# Patient Record
Sex: Male | Born: 1950 | Race: White | Hispanic: No | Marital: Married | Smoking: Never smoker
Health system: Southern US, Community
[De-identification: ages and names within clinical notes are randomized; demographics above are authoritative.]

## PROBLEM LIST (undated history)

## (undated) DIAGNOSIS — I213 ST elevation (STEMI) myocardial infarction of unspecified site: Secondary | ICD-10-CM

## (undated) DIAGNOSIS — Z955 Presence of coronary angioplasty implant and graft: Secondary | ICD-10-CM

## (undated) HISTORY — DX: Presence of coronary angioplasty implant and graft: Z95.5

## (undated) HISTORY — DX: ST elevation (STEMI) myocardial infarction of unspecified site: I21.3

---

## 2012-11-27 DIAGNOSIS — Z955 Presence of coronary angioplasty implant and graft: Secondary | ICD-10-CM

## 2012-11-27 DIAGNOSIS — I213 ST elevation (STEMI) myocardial infarction of unspecified site: Secondary | ICD-10-CM

## 2012-11-27 HISTORY — DX: Presence of coronary angioplasty implant and graft: Z95.5

## 2012-11-27 HISTORY — DX: ST elevation (STEMI) myocardial infarction of unspecified site: I21.3

## 2013-01-16 ENCOUNTER — Encounter (HOSPITAL_COMMUNITY): Payer: Self-pay | Admitting: *Deleted

## 2013-01-16 ENCOUNTER — Telehealth (HOSPITAL_COMMUNITY): Payer: Self-pay | Admitting: *Deleted

## 2013-01-17 ENCOUNTER — Encounter (HOSPITAL_COMMUNITY)
Admission: RE | Admit: 2013-01-17 | Discharge: 2013-01-17 | Disposition: A | Discharge status: Home / Self Care | Payer: Managed Care, Other (non HMO) | Source: Ambulatory Visit | Attending: Cardiology | Admitting: Cardiology

## 2013-01-17 DIAGNOSIS — Z5189 Encounter for other specified aftercare: Secondary | ICD-10-CM | POA: Insufficient documentation

## 2013-01-17 DIAGNOSIS — I252 Old myocardial infarction: Secondary | ICD-10-CM | POA: Insufficient documentation

## 2013-01-17 NOTE — Progress Notes (Signed)
Cardiac Rehab Medication Review by a Pharmacist  Does the patient  feel that his/her medications are working for him/her?  yes  Has the patient been experiencing any side effects to the medications prescribed?  no  Does the patient measure his/her own blood pressure or blood glucose at home?  yes   Does the patient have any problems obtaining medications due to transportation or finances?   no  Understanding of regimen: good Understanding of indications: fair Potential of compliance: good    Pharmacist comments: Martin Moreno is a pleasant 62 yo male in for Cardiac Rehab, and he brings with him a list of his medications that his wife created.  He reports no side effects with his medications.  He checks his blood pressure around twice a week, but does not keep a log.  Since his heart attach, he has been very compliant with his meds, and understands the importance of the Brilinta with his stents.  He was unsure why he was taking some of his meds, so we went through each one and talked about its indications.  Martin Moreno takes no OTC items or herbals.    Shelba Flake Achilles Dunk, PharmD Clinical Pharmacist - Resident Pager: [phone] Pharmacy: [phone] 01/17/2013 9:32 AM

## 2013-01-21 ENCOUNTER — Encounter (HOSPITAL_COMMUNITY)
Admission: RE | Admit: 2013-01-21 | Discharge: 2013-01-21 | Disposition: A | Discharge status: Home / Self Care | Payer: Managed Care, Other (non HMO) | Source: Ambulatory Visit | Attending: Cardiology | Admitting: Cardiology

## 2013-01-21 LAB — GLUCOSE, CAPILLARY
Glucose-Capillary: 126 mg/dL — ABNORMAL HIGH (ref 70–99)
Glucose-Capillary: 101 mg/dL — ABNORMAL HIGH (ref 70–99)

## 2013-01-21 NOTE — Progress Notes (Signed)
Patient tolerated exercise session well. Oriented to gym safety and routines. Reviewed medications and PQ9. Patient feels he is coping with day to day living and sees cardiac rehab as an opportunity to build confidence.  Patient understands to communicate any needs to staff. Staff will continue to provide encouragement and support. VSS, ECG tracing NSR.

## 2013-01-23 ENCOUNTER — Encounter (HOSPITAL_COMMUNITY)
Admission: RE | Admit: 2013-01-23 | Discharge: 2013-01-23 | Disposition: A | Discharge status: Home / Self Care | Payer: Managed Care, Other (non HMO) | Source: Ambulatory Visit | Attending: Cardiology | Admitting: Cardiology

## 2013-01-23 ENCOUNTER — Encounter (HOSPITAL_COMMUNITY): Payer: Managed Care, Other (non HMO)

## 2013-01-23 LAB — GLUCOSE, CAPILLARY: Glucose-Capillary: 118 mg/dL — ABNORMAL HIGH (ref 70–99)

## 2013-01-25 ENCOUNTER — Encounter (HOSPITAL_COMMUNITY)
Admission: RE | Admit: 2013-01-25 | Discharge: 2013-01-25 | Disposition: A | Discharge status: Home / Self Care | Payer: Managed Care, Other (non HMO) | Source: Ambulatory Visit | Attending: Cardiology | Admitting: Cardiology

## 2013-01-25 DIAGNOSIS — I252 Old myocardial infarction: Secondary | ICD-10-CM | POA: Insufficient documentation

## 2013-01-25 DIAGNOSIS — Z5189 Encounter for other specified aftercare: Secondary | ICD-10-CM | POA: Insufficient documentation

## 2013-01-25 LAB — GLUCOSE, CAPILLARY: Glucose-Capillary: 101 mg/dL — ABNORMAL HIGH (ref 70–99)

## 2013-01-28 ENCOUNTER — Encounter (HOSPITAL_COMMUNITY)
Admission: RE | Admit: 2013-01-28 | Discharge: 2013-01-28 | Disposition: A | Discharge status: Home / Self Care | Payer: Managed Care, Other (non HMO) | Source: Ambulatory Visit | Attending: Cardiology | Admitting: Cardiology

## 2013-01-28 NOTE — Progress Notes (Signed)
Martin Moreno 62 y.o. male Nutrition Note Spoke with pt.  Nutrition Plan and Nutrition Survey goals reviewed with pt. Pt is following Step 2 of the Therapeutic Lifestyle Changes diet. Pt wants to lose wt. Pt has been trying to lose wt by "cutting out the fried foods and sweets." Pt reports his wt was 195 lb. Wt today reportedly 179.7 lb (81.7 kg). Pt with 7.3 lb wt loss over the past 2 weeks. Rate of wt loss greater than desired rate. Rate of wt loss discussed with pt. Per discussion, pt has "some nausea," which has improved, and pt has a significant amount of "food fear." Pt's fear of food discussed and misconceptions clarified. Wt loss tips reviewed. Pt is diabetic. No recent A1c noted. Pt states, "I was on a dessert binge this spring until my heart attack so my doctor increased my Metformin to 850 mg twice a day." Pt CBG's have been below the desired pre-exercise goal of >100 mg/dL since starting rehab. Martin Furlong, RN to fax pt CBG's to his PCP at the end of this week per pt's PCP RN request. ? If pt may benefit from a decrease in Metformin. This Clinical research associate went over Diabetes Education test results. Pt expressed understanding of the information reviewed. Pt aware of nutrition education classes offered and is unable to attend nutrition classes. Wt Readings from Last 3 Encounters:  01/17/13 187 lb 6.3 oz (85 kg)  Nutrition Diagnosis   Food-and nutrition-related knowledge deficit related to lack of exposure to information as related to diagnosis of: ? CVD ? DM    Obesity related to excessive energy intake as evidenced by a BMI of 30.9  Nutrition RX/ Estimated Daily Nutrition Needs for: wt loss  1400-1900 Kcal, 35-50 gm fat, 9-13 gm sat fat, 1.3-1.9 gm trans-fat, <1500 mg sodium, 175 gm CHO   Nutrition Intervention   Pt's individual nutrition plan reviewed with pt.   Benefits of adopting Therapeutic Lifestyle Changes discussed when Medficts reviewed.   Pt to attend the Portion Distortion class   Pt  given handouts for: ? Nutrition I class ? Nutrition II class ? Diabetes Blitz class   Continue client-centered nutrition education by RD, as part of interdisciplinary care. Goal(s)   Pt to identify food quantities necessary to achieve: ? wt loss to a goal wt of 163-181 lb (74.1-82.3 kg) at graduation from cardiac rehab.  Monitor and Evaluate progress toward nutrition goal with team. Nutrition Risk: Change to Moderate   Martin Moreno, M.Ed, RD, LDN, CDE 01/28/2013 8:13 AM

## 2013-01-30 ENCOUNTER — Encounter (HOSPITAL_COMMUNITY)
Admission: RE | Admit: 2013-01-30 | Discharge: 2013-01-30 | Disposition: A | Discharge status: Home / Self Care | Payer: Managed Care, Other (non HMO) | Source: Ambulatory Visit | Attending: Cardiology | Admitting: Cardiology

## 2013-02-01 ENCOUNTER — Encounter (HOSPITAL_COMMUNITY)
Admission: RE | Admit: 2013-02-01 | Discharge: 2013-02-01 | Disposition: A | Discharge status: Home / Self Care | Payer: Managed Care, Other (non HMO) | Source: Ambulatory Visit | Attending: Cardiology | Admitting: Cardiology

## 2013-02-01 NOTE — Progress Notes (Signed)
Reviewed home exercise with pt today.  Pt plans to walk outside in his neighborhood for 30 minutes, 2-4 days per week outside of Cardiac Rehab for exercise.  Reviewed THR, pulse, RPE, sign and symptoms, NTG use, and when to call 911 or MD.  Pt voiced understanding.  Alexia Freestone, MS, ACSM RCEP 8:13 AM

## 2013-02-04 ENCOUNTER — Encounter (HOSPITAL_COMMUNITY)
Admission: RE | Admit: 2013-02-04 | Discharge: 2013-02-04 | Disposition: A | Discharge status: Home / Self Care | Payer: Managed Care, Other (non HMO) | Source: Ambulatory Visit | Attending: Cardiology | Admitting: Cardiology

## 2013-02-06 ENCOUNTER — Encounter (HOSPITAL_COMMUNITY)
Admission: RE | Admit: 2013-02-06 | Discharge: 2013-02-06 | Disposition: A | Discharge status: Home / Self Care | Payer: Managed Care, Other (non HMO) | Source: Ambulatory Visit | Attending: Cardiology | Admitting: Cardiology

## 2013-02-08 ENCOUNTER — Encounter (HOSPITAL_COMMUNITY)
Admission: RE | Admit: 2013-02-08 | Discharge: 2013-02-08 | Disposition: A | Discharge status: Home / Self Care | Payer: Managed Care, Other (non HMO) | Source: Ambulatory Visit | Attending: Cardiology | Admitting: Cardiology

## 2013-02-11 ENCOUNTER — Encounter (HOSPITAL_COMMUNITY)
Admission: RE | Admit: 2013-02-11 | Discharge: 2013-02-11 | Disposition: A | Discharge status: Home / Self Care | Payer: Managed Care, Other (non HMO) | Source: Ambulatory Visit | Attending: Cardiology | Admitting: Cardiology

## 2013-02-13 ENCOUNTER — Encounter (HOSPITAL_COMMUNITY)
Admission: RE | Admit: 2013-02-13 | Discharge: 2013-02-13 | Disposition: A | Discharge status: Home / Self Care | Payer: Managed Care, Other (non HMO) | Source: Ambulatory Visit | Attending: Cardiology | Admitting: Cardiology

## 2013-02-13 NOTE — Progress Notes (Signed)
At the end of exercise, pt complained of feeling dizzy.  BP checked 80/50.  Pt given gatorade to drink along with crackers.  Pt put his feet up during cool down.  Recheck of bp 95/60 with resolution of dizziness.  Pt given a second glass of gatorade and recheck standing 104/58 with no complaints.  Pt felt able to drive self home.  Pt seen in the office at [facility] center.  Pt given rehab report for MD review.  Pt reports that his atrovastatin was decreased to 40mg  daily.

## 2013-02-15 ENCOUNTER — Encounter (HOSPITAL_COMMUNITY)
Admission: RE | Admit: 2013-02-15 | Discharge: 2013-02-15 | Disposition: A | Discharge status: Home / Self Care | Payer: Managed Care, Other (non HMO) | Source: Ambulatory Visit | Attending: Cardiology | Admitting: Cardiology

## 2013-02-18 ENCOUNTER — Encounter (HOSPITAL_COMMUNITY)
Admission: RE | Admit: 2013-02-18 | Discharge: 2013-02-18 | Disposition: A | Discharge status: Home / Self Care | Payer: Managed Care, Other (non HMO) | Source: Ambulatory Visit | Attending: Cardiology | Admitting: Cardiology

## 2013-02-20 ENCOUNTER — Encounter (HOSPITAL_COMMUNITY)
Admission: RE | Admit: 2013-02-20 | Discharge: 2013-02-20 | Disposition: A | Discharge status: Home / Self Care | Payer: Managed Care, Other (non HMO) | Source: Ambulatory Visit | Attending: Cardiology | Admitting: Cardiology

## 2013-02-22 ENCOUNTER — Encounter (HOSPITAL_COMMUNITY)
Admission: RE | Admit: 2013-02-22 | Discharge: 2013-02-22 | Disposition: A | Discharge status: Home / Self Care | Payer: Managed Care, Other (non HMO) | Source: Ambulatory Visit | Attending: Cardiology | Admitting: Cardiology

## 2013-02-27 ENCOUNTER — Encounter (HOSPITAL_COMMUNITY)
Admission: RE | Admit: 2013-02-27 | Discharge: 2013-02-27 | Disposition: A | Discharge status: Home / Self Care | Payer: Managed Care, Other (non HMO) | Source: Ambulatory Visit | Attending: Cardiology | Admitting: Cardiology

## 2013-02-27 DIAGNOSIS — Z5189 Encounter for other specified aftercare: Secondary | ICD-10-CM | POA: Insufficient documentation

## 2013-02-27 DIAGNOSIS — I252 Old myocardial infarction: Secondary | ICD-10-CM | POA: Insufficient documentation

## 2013-03-01 ENCOUNTER — Encounter (HOSPITAL_COMMUNITY)
Admission: RE | Admit: 2013-03-01 | Discharge: 2013-03-01 | Disposition: A | Discharge status: Home / Self Care | Payer: Managed Care, Other (non HMO) | Source: Ambulatory Visit | Attending: Cardiology | Admitting: Cardiology

## 2013-03-04 ENCOUNTER — Encounter (HOSPITAL_COMMUNITY)
Admission: RE | Admit: 2013-03-04 | Discharge: 2013-03-04 | Disposition: A | Discharge status: Home / Self Care | Payer: Managed Care, Other (non HMO) | Source: Ambulatory Visit | Attending: Cardiology | Admitting: Cardiology

## 2013-03-06 ENCOUNTER — Encounter (HOSPITAL_COMMUNITY)
Admission: RE | Admit: 2013-03-06 | Discharge: 2013-03-06 | Disposition: A | Discharge status: Home / Self Care | Payer: Managed Care, Other (non HMO) | Source: Ambulatory Visit | Attending: Cardiology | Admitting: Cardiology

## 2013-03-06 NOTE — Progress Notes (Signed)
PSYCHOSOCIAL ASSESSMENT  Pt psychosocial assessment reveals no barriers to rehab participation.  Pt quality of life is slightly altered by his feelings towards his cardiac event.  Pt is concerned for his future and doesn't feel positive about his chances for living a long life.  Pt originally reported low scores for dyspnea and fatigue with activity however pt is pleased to report these symptoms have significantly improved with his exercise pattern and weight loss.   Overall pt exhibits positive coping skills and has supportive family.  Offered emotional support and reassurance.  Will continue to monitor.

## 2013-03-08 ENCOUNTER — Encounter (HOSPITAL_COMMUNITY)
Admission: RE | Admit: 2013-03-08 | Discharge: 2013-03-08 | Disposition: A | Discharge status: Home / Self Care | Payer: Managed Care, Other (non HMO) | Source: Ambulatory Visit | Attending: Cardiology | Admitting: Cardiology

## 2013-03-11 ENCOUNTER — Encounter (HOSPITAL_COMMUNITY)
Admission: RE | Admit: 2013-03-11 | Discharge: 2013-03-11 | Disposition: A | Discharge status: Home / Self Care | Payer: Managed Care, Other (non HMO) | Source: Ambulatory Visit | Attending: Cardiology | Admitting: Cardiology

## 2013-03-13 ENCOUNTER — Encounter (HOSPITAL_COMMUNITY)
Admission: RE | Admit: 2013-03-13 | Discharge: 2013-03-13 | Disposition: A | Discharge status: Home / Self Care | Payer: Managed Care, Other (non HMO) | Source: Ambulatory Visit | Attending: Cardiology | Admitting: Cardiology

## 2013-03-15 ENCOUNTER — Encounter (HOSPITAL_COMMUNITY)
Admission: RE | Admit: 2013-03-15 | Discharge: 2013-03-15 | Disposition: A | Discharge status: Home / Self Care | Payer: Managed Care, Other (non HMO) | Source: Ambulatory Visit | Attending: Cardiology | Admitting: Cardiology

## 2013-03-15 NOTE — Progress Notes (Signed)
Pt reports second episode of chest discomfort occuring at home.  Pt rates this event as a 2 in comparison to the event on Sunday that was a 3.  This episode lasted 1 1/2 minutes.  Pt denied having any exertional or stressful activity prior to.  Pt was doing some yard work but did not feel overly fatigued or taxed completing the task. Pt did not take any NTG because the pain went away so quickly.  Will plan to follow up with Md office.  Await response from previous fax sent over on Monday.

## 2013-03-18 ENCOUNTER — Encounter (HOSPITAL_COMMUNITY)
Admission: RE | Admit: 2013-03-18 | Discharge: 2013-03-18 | Disposition: A | Discharge status: Home / Self Care | Payer: Managed Care, Other (non HMO) | Source: Ambulatory Visit | Attending: Cardiology | Admitting: Cardiology

## 2013-03-20 ENCOUNTER — Encounter (HOSPITAL_COMMUNITY)
Admission: RE | Admit: 2013-03-20 | Discharge: 2013-03-20 | Disposition: A | Discharge status: Home / Self Care | Payer: Managed Care, Other (non HMO) | Source: Ambulatory Visit | Attending: Cardiology | Admitting: Cardiology

## 2013-03-22 ENCOUNTER — Encounter (HOSPITAL_COMMUNITY)
Admission: RE | Admit: 2013-03-22 | Discharge: 2013-03-22 | Disposition: A | Discharge status: Home / Self Care | Payer: Managed Care, Other (non HMO) | Source: Ambulatory Visit | Attending: Cardiology | Admitting: Cardiology

## 2013-03-22 NOTE — Progress Notes (Signed)
Pt complained of feeling light headed during the third station of exercise. Checked pt bp 122/60.  Checked pt blood sugar - 83.  Pt takes metformin 850 mg twice a day.  Pt did not take his blood sugar this morning and does not regularly check it at home.  Pt had a banana to eat for breakfast.  Pt does not eat heavy meals especially for breakfast.  Pt given ginger ale, banana and peanut butter crackers to eat.  Counseled pt on diabetes management and stressed the importance of checking his blood sugar at home on a regular basis.  Pt with HGA1c of 6.0 in July.  Pt post check after 15 minutes 150.  Pt felt better and denies any symptoms.  Pt plans to hold his metformin doses during the weekend and monitor his blood sugar on a regular basis and record the readings and report them to rehab staff. Review signs and symptoms of low and high blood sugar.  Pt verbalized understanding.  Handouts given.

## 2013-03-22 NOTE — Progress Notes (Signed)
Received call from [facility].  Md reviewed information from previous faxes.  Will plan to increase Imdur to 60mg  daily.  Office left message on pt's answering machine.  Will confirm with pt that message was received and understood.

## 2013-03-25 ENCOUNTER — Encounter (HOSPITAL_COMMUNITY)
Admission: RE | Admit: 2013-03-25 | Discharge: 2013-03-25 | Disposition: A | Discharge status: Home / Self Care | Payer: Managed Care, Other (non HMO) | Source: Ambulatory Visit | Attending: Cardiology | Admitting: Cardiology

## 2013-03-25 LAB — GLUCOSE, CAPILLARY
Glucose-Capillary: 199 mg/dL — ABNORMAL HIGH (ref 70–99)
Glucose-Capillary: 225 mg/dL — ABNORMAL HIGH (ref 70–99)

## 2013-03-27 ENCOUNTER — Encounter (HOSPITAL_COMMUNITY)
Admission: RE | Admit: 2013-03-27 | Discharge: 2013-03-27 | Disposition: A | Discharge status: Home / Self Care | Payer: Managed Care, Other (non HMO) | Source: Ambulatory Visit | Attending: Cardiology | Admitting: Cardiology

## 2013-03-27 DIAGNOSIS — Z5189 Encounter for other specified aftercare: Secondary | ICD-10-CM | POA: Insufficient documentation

## 2013-03-27 DIAGNOSIS — I252 Old myocardial infarction: Secondary | ICD-10-CM | POA: Insufficient documentation

## 2013-03-28 ENCOUNTER — Telehealth: Payer: Self-pay | Admitting: General Surgery

## 2013-03-28 NOTE — Telephone Encounter (Signed)
Pt is aware and shared his PCP info with me so I could forward it for him.

## 2013-03-28 NOTE — Telephone Encounter (Signed)
Message copied by Nita Sells on Thu Mar 28, 2013  3:45 PM ------      Message from: Armanda Magic R      Created: Wed Mar 27, 2013  7:51 PM       Forward to PCP ------

## 2013-03-29 ENCOUNTER — Encounter (HOSPITAL_COMMUNITY)
Admission: RE | Admit: 2013-03-29 | Discharge: 2013-03-29 | Disposition: A | Discharge status: Home / Self Care | Payer: Managed Care, Other (non HMO) | Source: Ambulatory Visit | Attending: Cardiology | Admitting: Cardiology

## 2013-03-29 ENCOUNTER — Ambulatory Visit (HOSPITAL_COMMUNITY)
Admission: RE | Admit: 2013-03-29 | Discharge: 2013-03-29 | Disposition: A | Discharge status: Home / Self Care | Payer: Managed Care, Other (non HMO) | Source: Ambulatory Visit | Attending: Family Medicine | Admitting: Family Medicine

## 2013-03-29 ENCOUNTER — Other Ambulatory Visit (HOSPITAL_COMMUNITY): Payer: Self-pay | Admitting: *Deleted

## 2013-03-29 DIAGNOSIS — M545 Low back pain, unspecified: Secondary | ICD-10-CM

## 2013-03-29 DIAGNOSIS — M51379 Other intervertebral disc degeneration, lumbosacral region without mention of lumbar back pain or lower extremity pain: Secondary | ICD-10-CM | POA: Insufficient documentation

## 2013-03-29 DIAGNOSIS — M5137 Other intervertebral disc degeneration, lumbosacral region: Secondary | ICD-10-CM | POA: Insufficient documentation

## 2013-03-29 DIAGNOSIS — X58XXXA Exposure to other specified factors, initial encounter: Secondary | ICD-10-CM | POA: Insufficient documentation

## 2013-03-29 DIAGNOSIS — S32009A Unspecified fracture of unspecified lumbar vertebra, initial encounter for closed fracture: Secondary | ICD-10-CM | POA: Insufficient documentation

## 2013-03-29 DIAGNOSIS — M47817 Spondylosis without myelopathy or radiculopathy, lumbosacral region: Secondary | ICD-10-CM | POA: Insufficient documentation

## 2013-03-29 IMAGING — CR DG LUMBAR SPINE COMPLETE 4+V
5 series · 5 of 5 positions shown · non-contrast
Comparison: None.

CLINICAL DATA: Low back pain

EXAM:
LUMBAR SPINE - COMPLETE 4+ VIEW

[t l-spine a.p.]
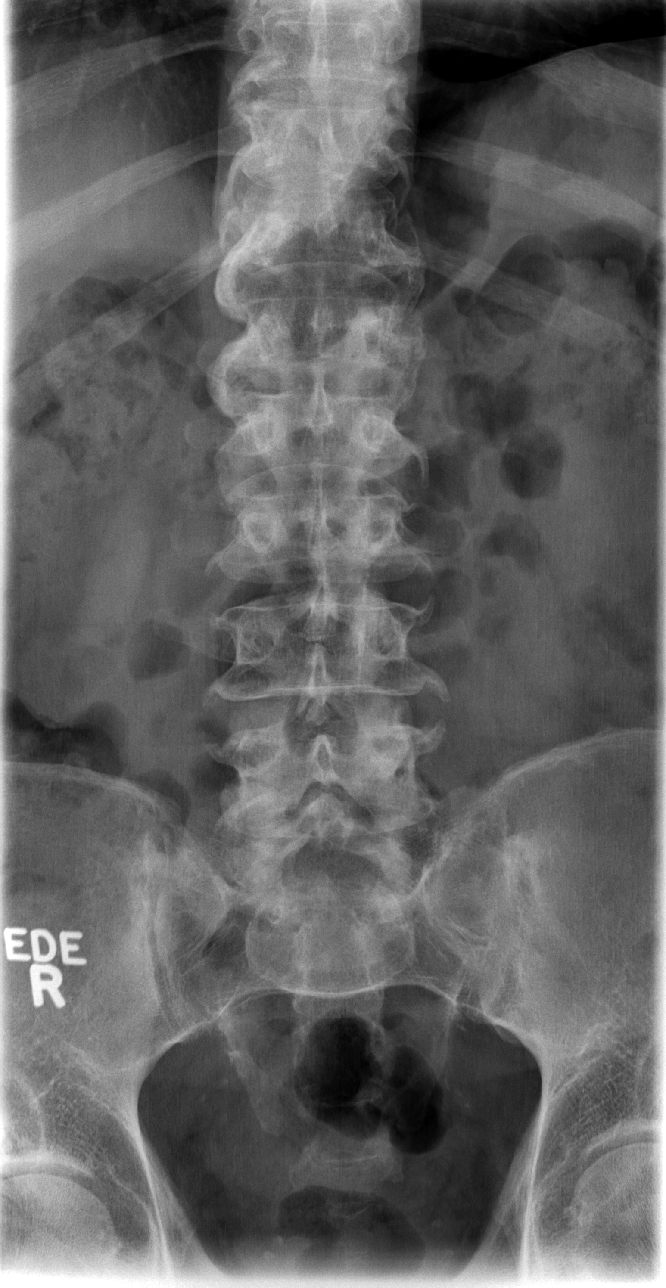

[t l-spine oblique exposure (1 of 2)]
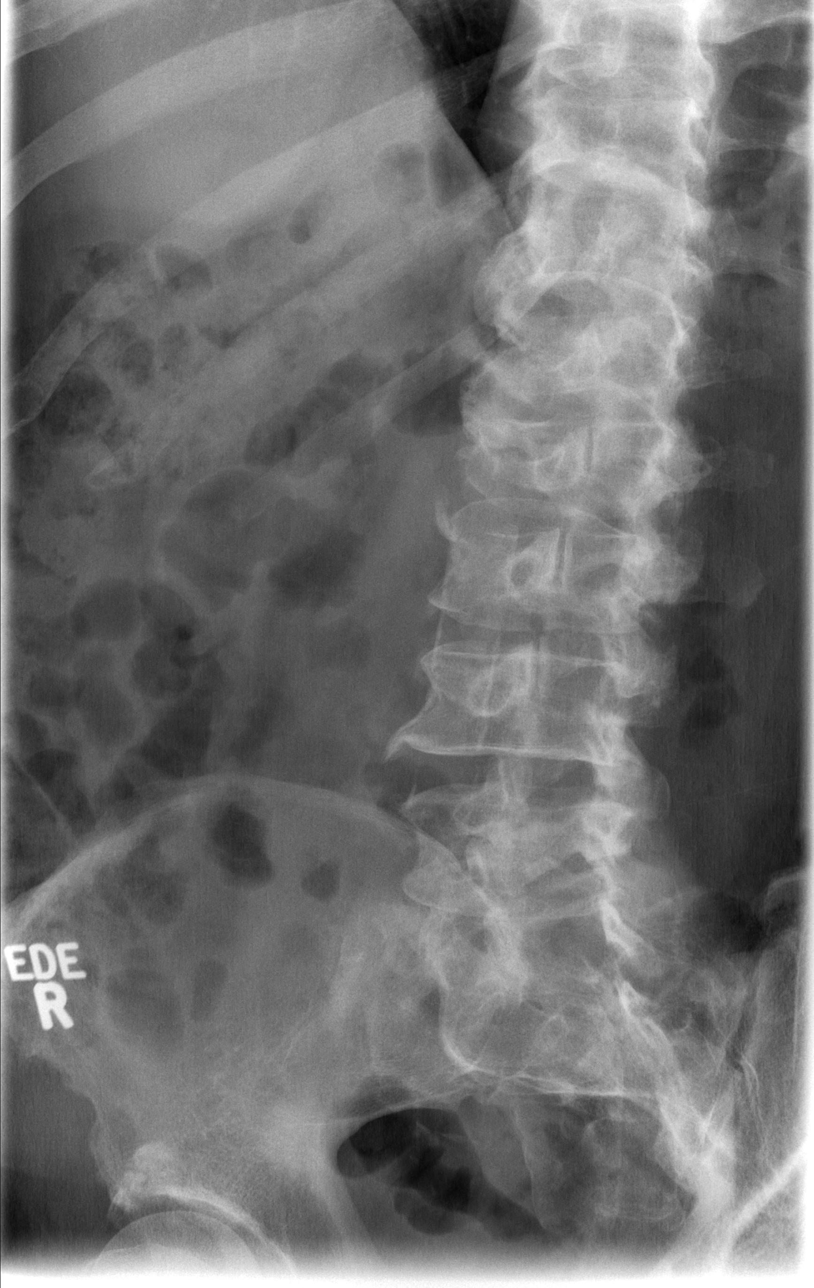

[t l-spine oblique exposure (2 of 2)]
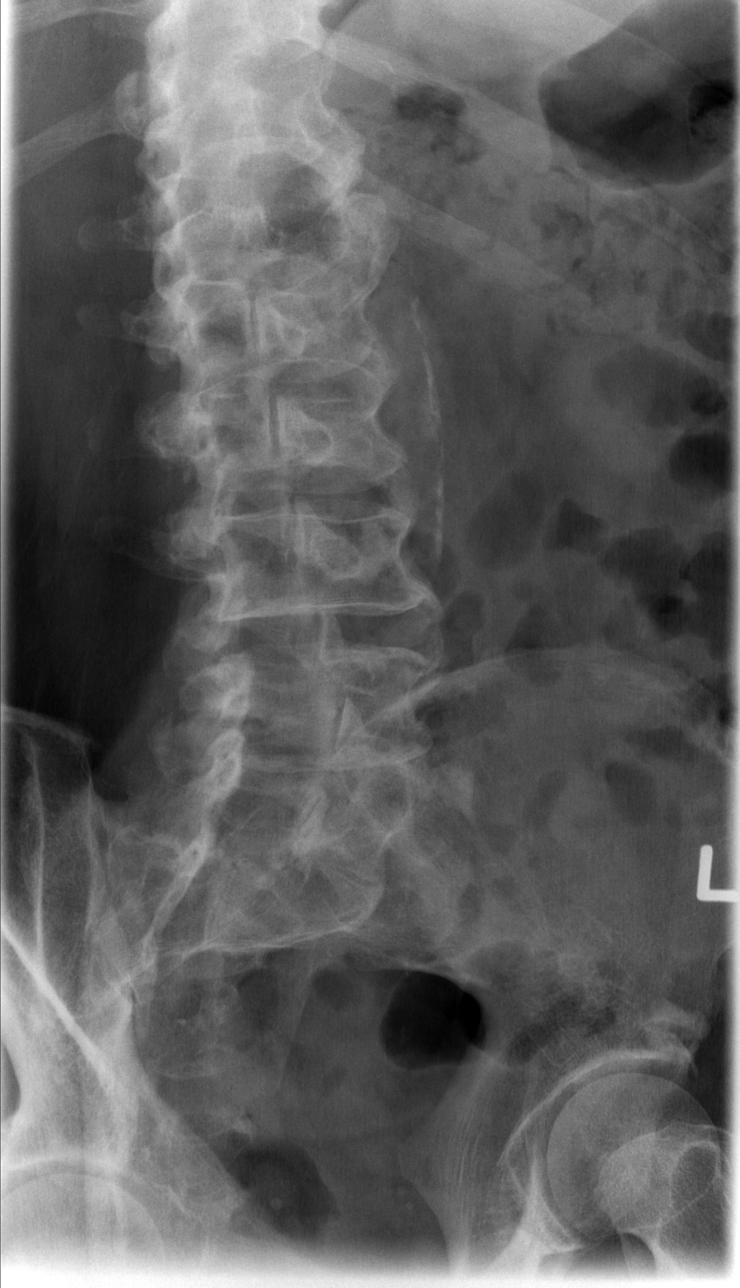

[t l-spine lat]
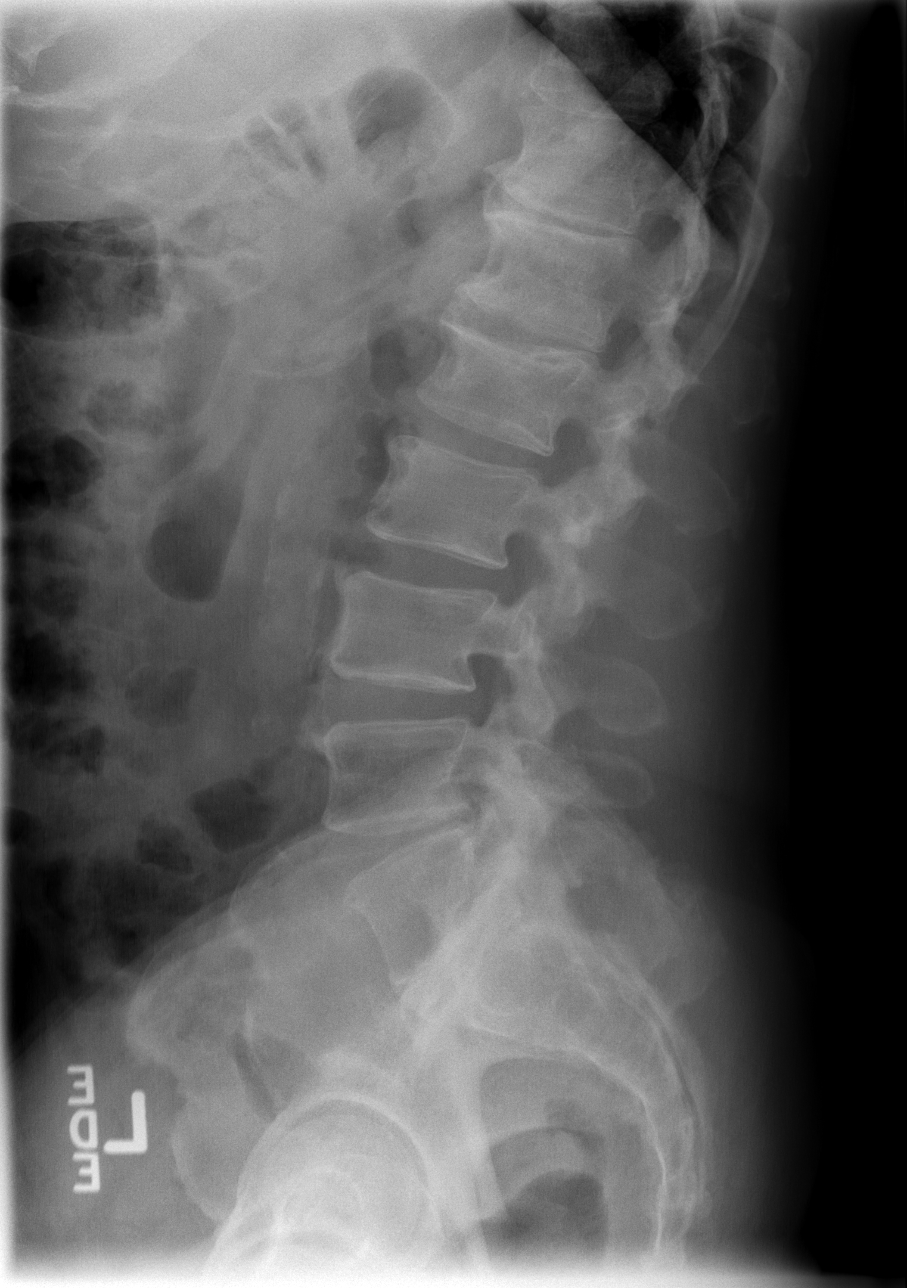

[t l-spine l5-s1 spot]
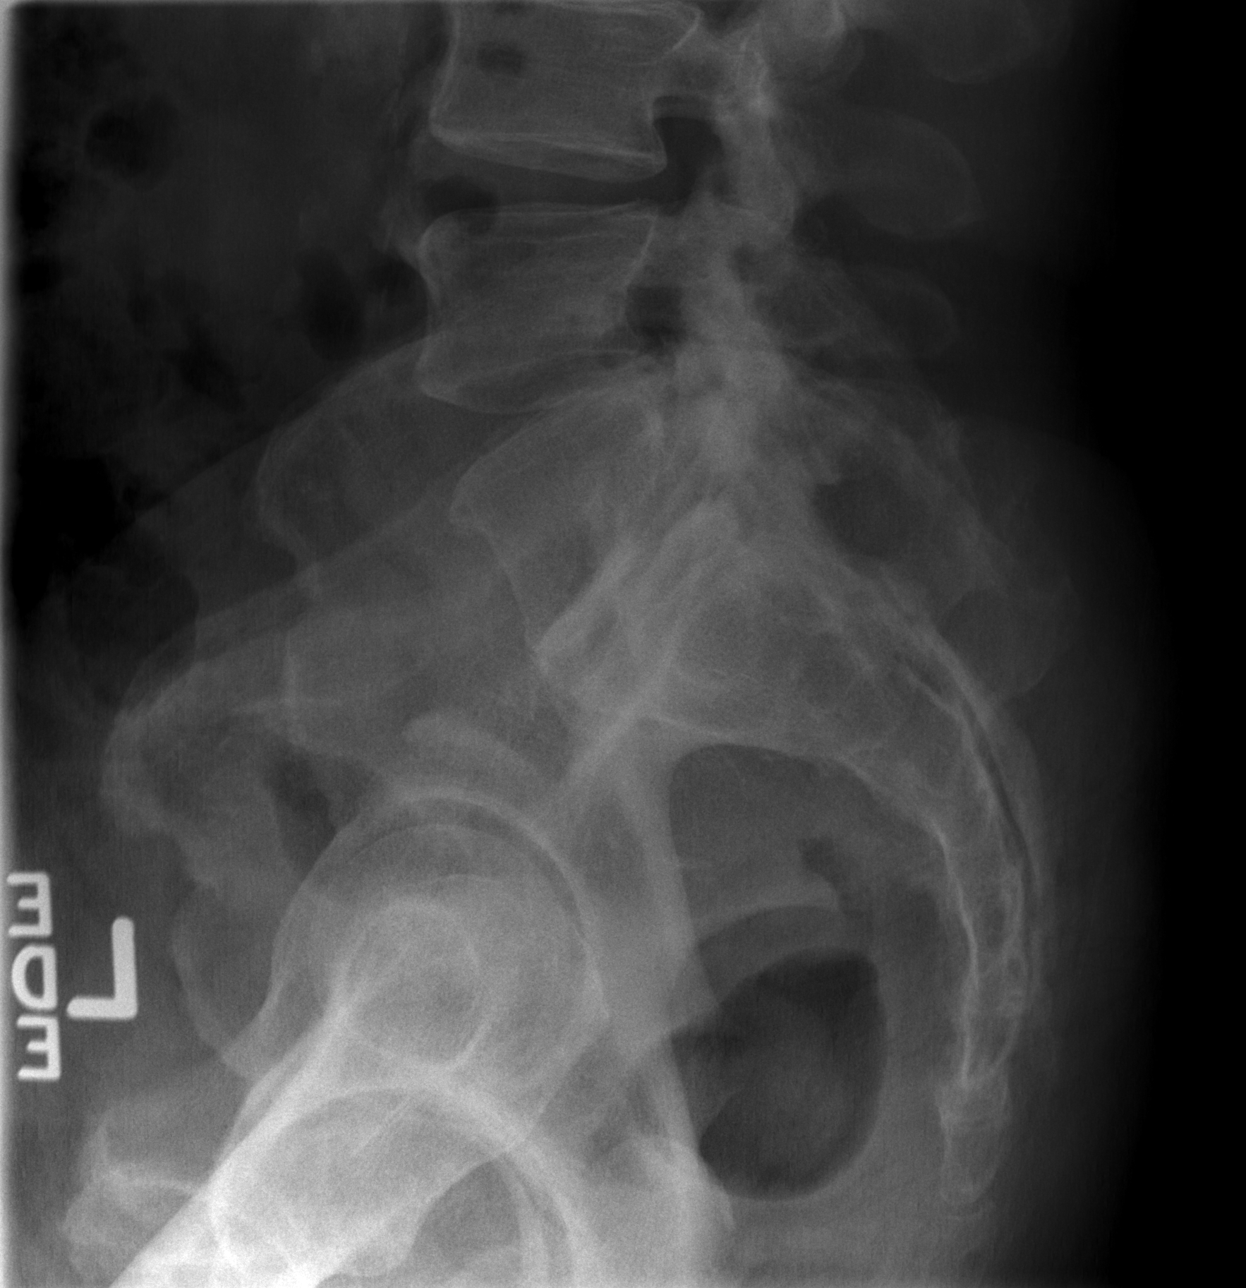

[5 of 5 positions shown; findings below may reference images not displayed]

FINDINGS: Mild fracture of L1 involving the superior endplate. This is
probably chronic but correlation space at this is probably chronic
with associated spurring however no prior studies available for
comparison. No other fracture

Normal alignment. Disc degeneration and spondylosis L4-5 and L5-S1.
Negative for pars defect.
IMPRESSION: Mild compression fracture L1, favor this is chronic. Correlate with
pain

Disc degeneration and spondylosis most prominent L4-5 and L5-S1.

## 2013-04-01 ENCOUNTER — Encounter (HOSPITAL_COMMUNITY)
Admission: RE | Admit: 2013-04-01 | Discharge: 2013-04-01 | Disposition: A | Discharge status: Home / Self Care | Payer: Managed Care, Other (non HMO) | Source: Ambulatory Visit | Attending: Cardiology | Admitting: Cardiology

## 2013-04-03 ENCOUNTER — Encounter (HOSPITAL_COMMUNITY)
Admission: RE | Admit: 2013-04-03 | Discharge: 2013-04-03 | Disposition: A | Discharge status: Home / Self Care | Payer: Managed Care, Other (non HMO) | Source: Ambulatory Visit | Attending: Cardiology | Admitting: Cardiology

## 2013-04-05 ENCOUNTER — Encounter (HOSPITAL_COMMUNITY)
Admission: RE | Admit: 2013-04-05 | Discharge: 2013-04-05 | Disposition: A | Discharge status: Home / Self Care | Payer: Managed Care, Other (non HMO) | Source: Ambulatory Visit | Attending: Cardiology | Admitting: Cardiology

## 2013-04-08 ENCOUNTER — Encounter (HOSPITAL_COMMUNITY)
Admission: RE | Admit: 2013-04-08 | Discharge: 2013-04-08 | Disposition: A | Discharge status: Home / Self Care | Payer: Managed Care, Other (non HMO) | Source: Ambulatory Visit | Attending: Cardiology | Admitting: Cardiology

## 2013-04-10 ENCOUNTER — Encounter (HOSPITAL_COMMUNITY)
Admission: RE | Admit: 2013-04-10 | Discharge: 2013-04-10 | Disposition: A | Discharge status: Home / Self Care | Payer: Managed Care, Other (non HMO) | Source: Ambulatory Visit | Attending: Cardiology | Admitting: Cardiology

## 2013-04-12 ENCOUNTER — Encounter (HOSPITAL_COMMUNITY)
Admission: RE | Admit: 2013-04-12 | Discharge: 2013-04-12 | Disposition: A | Discharge status: Home / Self Care | Payer: Managed Care, Other (non HMO) | Source: Ambulatory Visit | Attending: Cardiology | Admitting: Cardiology

## 2013-04-15 ENCOUNTER — Encounter (HOSPITAL_COMMUNITY)
Admission: RE | Admit: 2013-04-15 | Discharge: 2013-04-15 | Disposition: A | Discharge status: Home / Self Care | Payer: Managed Care, Other (non HMO) | Source: Ambulatory Visit | Attending: Cardiology | Admitting: Cardiology

## 2013-04-15 NOTE — Progress Notes (Signed)
Pt complete 36 exercise session in Cardiac Rehab phase II.  Pt made excellent progress with goals.  Pt lost 8.7 kg since the first day of exercise.  Pt has made many therapeutic lifestyle changes and should be congratulated on his efforts.  Pt plans to continue his home exercise with walking and treadmill on colder days.  Medication list reconciled.  Repeat PhQ2 - 2.

## 2013-04-17 ENCOUNTER — Encounter (HOSPITAL_COMMUNITY): Payer: Managed Care, Other (non HMO)

## 2013-04-19 ENCOUNTER — Encounter (HOSPITAL_COMMUNITY): Payer: Managed Care, Other (non HMO)

## 2013-04-22 ENCOUNTER — Encounter (HOSPITAL_COMMUNITY): Payer: Managed Care, Other (non HMO)

## 2013-04-24 ENCOUNTER — Encounter (HOSPITAL_COMMUNITY): Payer: Managed Care, Other (non HMO)

## 2013-04-26 ENCOUNTER — Encounter (HOSPITAL_COMMUNITY): Payer: Managed Care, Other (non HMO)

## 2014-01-20 ENCOUNTER — Other Ambulatory Visit: Payer: Self-pay | Admitting: Physical Medicine and Rehabilitation

## 2014-01-20 DIAGNOSIS — M546 Pain in thoracic spine: Secondary | ICD-10-CM

## 2014-01-20 DIAGNOSIS — M545 Low back pain, unspecified: Secondary | ICD-10-CM

## 2014-01-27 ENCOUNTER — Ambulatory Visit
Admission: RE | Admit: 2014-01-27 | Discharge: 2014-01-27 | Disposition: A | Discharge status: Home / Self Care | Payer: Managed Care, Other (non HMO) | Source: Ambulatory Visit | Attending: Physical Medicine and Rehabilitation | Admitting: Physical Medicine and Rehabilitation

## 2014-01-27 DIAGNOSIS — M545 Low back pain, unspecified: Secondary | ICD-10-CM

## 2014-01-27 DIAGNOSIS — M546 Pain in thoracic spine: Secondary | ICD-10-CM

## 2014-01-27 MED ORDER — IOHEXOL 180 MG/ML  SOLN
1.0000 mL | Freq: Once | INTRAMUSCULAR | Status: AC | PRN
  Administered 2014-01-27: 1 mL via EPIDURAL

## 2014-01-27 MED ORDER — METHYLPREDNISOLONE ACETATE 40 MG/ML INJ SUSP (RADIOLOG
120.0000 mg | Freq: Once | INTRAMUSCULAR | Status: AC
  Administered 2014-01-27: 120 mg via EPIDURAL

## 2014-01-27 NOTE — Discharge Instructions (Signed)

## 2014-04-16 ENCOUNTER — Other Ambulatory Visit: Payer: Self-pay | Admitting: Physical Medicine and Rehabilitation

## 2014-04-16 DIAGNOSIS — M5136 Other intervertebral disc degeneration, lumbar region: Secondary | ICD-10-CM

## 2014-04-21 ENCOUNTER — Ambulatory Visit
Admission: RE | Admit: 2014-04-21 | Discharge: 2014-04-21 | Disposition: A | Discharge status: Home / Self Care | Payer: Managed Care, Other (non HMO) | Source: Ambulatory Visit | Attending: Physical Medicine and Rehabilitation | Admitting: Physical Medicine and Rehabilitation

## 2014-04-21 DIAGNOSIS — M5136 Other intervertebral disc degeneration, lumbar region: Secondary | ICD-10-CM

## 2014-04-21 MED ORDER — METHYLPREDNISOLONE ACETATE 40 MG/ML INJ SUSP (RADIOLOG
120.0000 mg | Freq: Once | INTRAMUSCULAR | Status: AC
  Administered 2014-04-21: 120 mg via EPIDURAL

## 2014-04-21 MED ORDER — IOHEXOL 180 MG/ML  SOLN
1.0000 mL | Freq: Once | INTRAMUSCULAR | Status: AC | PRN
  Administered 2014-04-21: 1 mL via EPIDURAL

## 2014-04-21 NOTE — Discharge Instructions (Signed)
